# Patient Record
Sex: Male | Born: 1960 | Hispanic: Yes | Marital: Single | Smoking: Never smoker
Health system: Southern US, Community
[De-identification: ages and names within clinical notes are randomized; demographics above are authoritative.]

## PROBLEM LIST (undated history)

## (undated) DIAGNOSIS — I10 Essential (primary) hypertension: Secondary | ICD-10-CM

## (undated) DIAGNOSIS — E119 Type 2 diabetes mellitus without complications: Secondary | ICD-10-CM

## (undated) HISTORY — DX: Essential (primary) hypertension: I10

---

## 2010-05-08 ENCOUNTER — Emergency Department (HOSPITAL_COMMUNITY): Admission: EM | Admit: 2010-05-08 | Discharge: 2010-05-08 | Payer: Self-pay | Admitting: Emergency Medicine

## 2010-09-10 LAB — POCT I-STAT 3, VENOUS BLOOD GAS (G3P V)
Acid-Base Excess: 5 mmol/L — ABNORMAL HIGH (ref 0.0–2.0)
Bicarbonate: 33.1 meq/L — ABNORMAL HIGH (ref 20.0–24.0)
O2 Saturation: 53 %
TCO2: 35 mmol/L (ref 0–100)
pCO2, Ven: 59.1 mmHg — ABNORMAL HIGH (ref 45.0–50.0)
pH, Ven: 7.356 — ABNORMAL HIGH (ref 7.250–7.300)
pO2, Ven: 30 mmHg (ref 30.0–45.0)

## 2010-09-10 LAB — DIFFERENTIAL
Basophils Relative: 0 % (ref 0–1)
Eosinophils Absolute: 0.1 10*3/uL (ref 0.0–0.7)
Eosinophils Relative: 1 % (ref 0–5)
Lymphs Abs: 2.1 10*3/uL (ref 0.7–4.0)

## 2010-09-10 LAB — GLUCOSE, CAPILLARY
Glucose-Capillary: 169 mg/dL — ABNORMAL HIGH (ref 70–99)
Glucose-Capillary: 328 mg/dL — ABNORMAL HIGH (ref 70–99)
Glucose-Capillary: 497 mg/dL — ABNORMAL HIGH (ref 70–99)

## 2010-09-10 LAB — BASIC METABOLIC PANEL
BUN: 16 mg/dL (ref 6–23)
CO2: 29 mEq/L (ref 19–32)
Chloride: 92 mEq/L — ABNORMAL LOW (ref 96–112)
Creatinine, Ser: 0.99 mg/dL (ref 0.4–1.5)
GFR calc Af Amer: >60 mL/min (ref >60)
Glucose, Bld: 408 mg/dL — ABNORMAL HIGH (ref 70–99)

## 2010-09-10 LAB — CBC
MCH: 32.1 pg (ref 26.0–34.0)
MCHC: 36.4 g/dL — ABNORMAL HIGH (ref 30.0–36.0)
MCV: 88 fL (ref 78.0–100.0)
Platelets: 240 10*3/uL (ref 150–400)
RBC: 5.49 MIL/uL (ref 4.22–5.81)

## 2010-09-10 LAB — URINALYSIS, ROUTINE W REFLEX MICROSCOPIC
Bilirubin Urine: NEGATIVE
Glucose, UA: >1000 mg/dL — AB
Ketones, ur: NEGATIVE mg/dL
pH: 6 (ref 5.0–8.0)

## 2014-09-11 ENCOUNTER — Ambulatory Visit (INDEPENDENT_AMBULATORY_CARE_PROVIDER_SITE_OTHER): Payer: Self-pay | Admitting: Internal Medicine

## 2014-09-11 VITALS — BP 132/84 | HR 89 | Temp 98.2°F | Resp 17 | Ht 67.0 in | Wt 182.0 lb

## 2014-09-11 DIAGNOSIS — Z789 Other specified health status: Secondary | ICD-10-CM

## 2014-09-11 DIAGNOSIS — I10 Essential (primary) hypertension: Secondary | ICD-10-CM

## 2014-09-11 DIAGNOSIS — Z79899 Other long term (current) drug therapy: Secondary | ICD-10-CM

## 2014-09-11 DIAGNOSIS — E118 Type 2 diabetes mellitus with unspecified complications: Secondary | ICD-10-CM

## 2014-09-11 LAB — POCT CBC
Granulocyte percent: 67.9 %G (ref 37–80)
HCT, POC: 48.4 % — AB (ref 37.7–47.9)
HEMOGLOBIN: 15.8 g/dL (ref 12.2–16.2)
LYMPH, POC: 1.5 (ref 0.6–3.4)
MCH: 30.9 pg (ref 27–31.2)
MCHC: 32.6 g/dL (ref 31.8–35.4)
MCV: 94.9 fL (ref 80–97)
MID (cbc): 0.4 (ref 0–0.9)
MPV: 7 fL (ref 0–99.8)
POC Granulocyte: 4 (ref 2–6.9)
POC LYMPH PERCENT: 24.9 %L (ref 10–50)
POC MID %: 7.2 % (ref 0–12)
Platelet Count, POC: 265 10*3/uL (ref 142–424)
RBC: 5.1 M/uL (ref 4.04–5.48)
RDW, POC: 12.9 %
WBC: 5.9 10*3/uL (ref 4.6–10.2)

## 2014-09-11 LAB — POCT URINALYSIS DIPSTICK
BILIRUBIN UA: NEGATIVE
Glucose, UA: 100
Ketones, UA: NEGATIVE
Leukocytes, UA: NEGATIVE
Nitrite, UA: NEGATIVE
Protein, UA: 30
RBC UA: NEGATIVE
SPEC GRAV UA: 1.02
Urobilinogen, UA: 0.2
pH, UA: 6

## 2014-09-11 LAB — COMPREHENSIVE METABOLIC PANEL
ALBUMIN: 4.6 g/dL (ref 3.5–5.2)
ALT: 22 U/L (ref 0–35)
AST: 18 U/L (ref 0–37)
Alkaline Phosphatase: 63 U/L (ref 39–117)
BILIRUBIN TOTAL: 0.9 mg/dL (ref 0.2–1.2)
BUN: 14 mg/dL (ref 6–23)
CHLORIDE: 96 meq/L (ref 96–112)
CO2: 31 mEq/L (ref 19–32)
Calcium: 10 mg/dL (ref 8.4–10.5)
Creat: 0.77 mg/dL (ref 0.50–1.10)
Glucose, Bld: 255 mg/dL — ABNORMAL HIGH (ref 70–99)
Potassium: 4.3 mEq/L (ref 3.5–5.3)
Sodium: 136 mEq/L (ref 135–145)
Total Protein: 8.2 g/dL (ref 6.0–8.3)

## 2014-09-11 LAB — LIPID PANEL
Cholesterol: 119 mg/dL (ref 0–200)
HDL: 42 mg/dL — ABNORMAL LOW (ref >=46)
LDL CALC: 48 mg/dL (ref 0–99)
Total CHOL/HDL Ratio: 2.8 Ratio
Triglycerides: 147 mg/dL (ref <150)
VLDL: 29 mg/dL (ref 0–40)

## 2014-09-11 LAB — POCT UA - MICROSCOPIC ONLY
Bacteria, U Microscopic: NEGATIVE
CRYSTALS, UR, HPF, POC: NEGATIVE
Casts, Ur, LPF, POC: NEGATIVE
MUCUS UA: NEGATIVE
YEAST UA: NEGATIVE

## 2014-09-11 LAB — IFOBT (OCCULT BLOOD): IFOBT: NEGATIVE

## 2014-09-11 LAB — POCT GLYCOSYLATED HEMOGLOBIN (HGB A1C): Hemoglobin A1C: 9.7

## 2014-09-11 LAB — GLUCOSE, POCT (MANUAL RESULT ENTRY): POC Glucose: 284 mg/dl — AB (ref 70–99)

## 2014-09-11 MED ORDER — LISINOPRIL-HYDROCHLOROTHIAZIDE 20-12.5 MG PO TABS: 1.0000 | ORAL_TABLET | Freq: Every day | ORAL | Status: DC

## 2014-09-11 MED ORDER — GLIPIZIDE 5 MG PO TABS: ORAL_TABLET | ORAL | Status: DC

## 2014-09-11 MED ORDER — METFORMIN HCL 1000 MG PO TABS: 1000.0000 mg | ORAL_TABLET | Freq: Two times a day (BID) | ORAL | Status: DC

## 2014-09-11 NOTE — Progress Notes (Signed)
Subjective:    Patient ID: Joshua Johnston, male    DOB: 10/08/1960, 54 y.o.   MRN: [id number]  HPI Speaks no englsh, office interpretor present. Patient feels good, needs refills of metformina and lisenipril. Has no MD, left town. Needs new MD., Requests cholesterol check also. This man has no insurance and cannot afford all care he needs today. Review of Systems  Constitutional: Negative.   HENT: Negative.   Eyes: Negative.   Respiratory: Negative.   Cardiovascular: Negative.   Gastrointestinal: Negative.   Endocrine: Negative.   Genitourinary: Negative.   Neurological: Negative.   Hematological: Negative.        Objective:   Physical Exam  Constitutional: She is oriented to person, place, and time. She appears well-developed and well-nourished. No distress.  HENT:  Head: Normocephalic.  Right Ear: External ear normal.  Left Ear: External ear normal.  Nose: Nose normal.  Mouth/Throat: Oropharynx is clear and moist.  Eyes: Conjunctivae and EOM are normal. Pupils are equal, round, and reactive to light.  Neck: Normal range of motion. Neck supple.  Cardiovascular: Normal rate, regular rhythm and normal heart sounds.   Pulmonary/Chest: Effort normal and breath sounds normal.  Abdominal: Soft. Bowel sounds are normal. He exhibits no distension and no mass. There is no tenderness. There is no rebound and no guarding. Hernia confirmed negative in the right inguinal area and confirmed negative in the left inguinal area.  Genitourinary: Rectum normal, prostate normal, testes normal and penis normal. Uncircumcised.  Musculoskeletal: Normal range of motion.  Lymphadenopathy:       Right: No inguinal adenopathy present.       Left: No inguinal adenopathy present.  Neurological: She is alert and oriented to person, place, and time. No cranial nerve deficit or sensory deficit. She exhibits normal muscle tone. Coordination normal.  Psychiatric: She has a normal mood and affect. Her  behavior is normal. Thought content normal.  Vitals reviewed.  Results for orders placed or performed in visit on 09/11/14  POCT CBC  Result Value Ref Range   WBC 5.9 4.6 - 10.2 K/uL   Lymph, poc 1.5 0.6 - 3.4   POC LYMPH PERCENT 24.9 10 - 50 %L   MID (cbc) 0.4 0 - 0.9   POC MID % 7.2 0 - 12 %M   POC Granulocyte 4.0 2 - 6.9   Granulocyte percent 67.9 37 - 80 %G   RBC 5.10 4.04 - 5.48 M/uL   Hemoglobin 15.8 12.2 - 16.2 g/dL   HCT, POC 78.2 (A) 95.6 - 47.9 %   MCV 94.9 80 - 97 fL   MCH, POC 30.9 27 - 31.2 pg   MCHC 32.6 31.8 - 35.4 g/dL   RDW, POC 21.3 %   Platelet Count, POC 265 142 - 424 K/uL   MPV 7.0 0 - 99.8 fL  POCT glucose (manual entry)  Result Value Ref Range   POC Glucose 284 (A) 70 - 99 mg/dl  POCT glycosylated hemoglobin (Hb A1C)  Result Value Ref Range   Hemoglobin A1C 9.7   POCT UA - Microscopic Only  Result Value Ref Range   WBC, Ur, HPF, POC 1-2    RBC, urine, microscopic 1-2    Bacteria, U Microscopic neg    Mucus, UA neg    Epithelial cells, urine per micros 0-1    Crystals, Ur, HPF, POC neg    Casts, Ur, LPF, POC neg    Yeast, UA neg   POCT urinalysis dipstick  Result Value Ref Range   Color, UA yellow    Clarity, UA clear    Glucose, UA 100    Bilirubin, UA neg    Ketones, UA neg    Spec Grav, UA 1.020    Blood, UA neg    pH, UA 6.0    Protein, UA 30    Urobilinogen, UA 0.2    Nitrite, UA neg    Leukocytes, UA Negative   IFOBT POC (occult bld, rslt in office)  Result Value Ref Range   IFOBT Negative           Assessment & Plan:

## 2014-09-11 NOTE — Patient Instructions (Signed)
Hipertensin (Hypertension) La hipertensin, conocida comnmente como presin arterial alta, se produce cuando la sangre bombea en las arterias con mucha fuerza. Las arterias son los vasos sanguneos que transportan la sangre desde el corazn hacia todas las partes del cuerpo. Una lectura de la presin arterial consiste en un nmero ms alto sobre un nmero ms bajo, por ejemplo, 110/72. El nmero ms alto (presin sistlica) corresponde a la presin interna de las arterias cuando el corazn [place]. El nmero ms bajo (presin diastlica) corresponde a la presin interna de las arterias cuando el corazn se relaja. En condiciones ideales, la presin arterial debe ser inferior a 120/80. La hipertensin fuerza al corazn a trabajar ms para Herbalist. Las arterias pueden estrecharse o ponerse rgidas. La hipertensin conlleva el riesgo de enfermedad cardaca, ictus y otros problemas.  [place] de riesgo de hipertensin son controlables, pero otros no lo son.  [employer] factores de riesgo que usted no puede Chief Technology Officer, se incluyen:   Manufacturing systems engineer. El riesgo es mayor para las Retail banker.  La edad. Los riesgos aumentan con la edad.  El sexo. Antes de los 45aos, los hombres corren ms [employer]. Despus de los 65aos, las mujeres corren ms [employer]. Entre los factores de riesgo que usted puede Chief Technology Officer, se incluyen:  No hacer la cantidad suficiente de actividad fsica o ejercicio.  Tener sobrepeso.  Consumir mucha grasa, azcar, caloras o sal en la dieta.  Beber alcohol en exceso. SIGNOS Y SNTOMAS Por lo general, la hipertensin no causa signos o sntomas. La hipertensin demasiado alta (crisis hipertensiva) puede causar dolor de cabeza, ansiedad, falta de aire y hemorragia nasal. DIAGNSTICO  Para detectar si usted tiene hipertensin, el mdico le medir la presin arterial mientras est sentado, con el brazo  levantado a la altura del corazn. Debe medirla al [facility] veces en el mismo brazo. Determinadas condiciones pueden causar una diferencia de presin arterial entre el brazo izquierdo y Insurance underwriter. El hecho de tener una sola lectura de la presin arterial ms alta que lo normal no significa que Stage manager. En el caso de tener una lectura de la presin arterial con un valor alto, pdale al mdico que la verifique nuevamente. [place] hipertensin arterial incluye hacer cambios en el estilo de vida y, posiblemente, tomar medicamentos. Un estilo de vida saludable puede ayudar a bajar la presin arterial alta. Quiz deba cambiar algunos hbitos. Los [employer] en el estilo de vida pueden incluir:  Seguir la dieta DASH. Esta dieta tiene un alto contenido de frutas, verduras y Psychologist, prison and probation services. Incluye poca cantidad de sal, carnes rojas y azcares agregados.  Hacer al menos 2horas de actividad fsica enrgica todas las semanas.  Perder peso, si es necesario.  No fumar.  Limitar el consumo de bebidas alcohlicas.  Aprender formas de reducir el estrs. Si los cambios en el estilo de vida no son suficientes para Child psychotherapist la presin arterial, el mdico puede recetarle medicamentos. Quiz necesite tomar ms de uno. Trabaje en conjunto con su mdico para comprender los riesgos y los beneficios. INSTRUCCIONES PARA EL CUIDADO EN EL HOGAR  Haga que le midan de nuevo la presin arterial segn las indicaciones del [place] los medicamentos solamente como se lo haya indicado el mdico. Siga cuidadosamente las indicaciones. Los medicamentos para la presin arterial deben tomarse segn las indicaciones. Los medicamentos pierden eficacia al omitir las dosis. El hecho de omitir  las dosis tambin [address] de otros [place].  No fume.  Contrlese la presin arterial en su casa segn las indicaciones del mdico. SOLICITE ATENCIN MDICA SI:   Piensa  que tiene una reaccin alrgica a los medicamentos.  Tiene mareos o dolores de cabeza con Naval architect.  Tiene hinchazn en los tobillos.  Tiene problemas de visin. SOLICITE ATENCIN MDICA DE INMEDIATO SI:  Siente un dolor de cabeza intenso o confusin.  Siente debilidad inusual, adormecimiento o que Hospital doctor.  Siente dolor intenso en el pecho o en el abdomen.  Vomita repetidas veces.  Tiene dificultad para respirar. ASEGRESE DE QUE:   Comprende estas instrucciones.  Controlar su afeccin.  Recibir ayuda de inmediato si no mejora o si empeora. Document Released: 06/16/2005 Document Revised: 10/31/2013 [facility] Patient Information 2015 [place], [place]. This information is not intended to replace advice given to you by your health care provider. Make sure you discuss any questions you have with your health care provider. Recuento bsico de carbohidratos para la diabetes mellitus (Basic Carbohydrate Counting for Diabetes Mellitus) El recuento de carbohidratos es un mtodo destinado a calcular la cantidad de carbohidratos en la dieta. El consumo de carbohidratos aumenta naturalmente el nivel de azcar (glucosa) en la sangre, por lo que es importante que sepa la cantidad que debe incluir en cada comida. El recuento de carbohidratos ayuda a Futures trader de glucosa en la sangre dentro de los lmites normales. La cantidad permitida de carbohidratos es diferente para cada persona. Un nutricionista puede ayudarlo a calcular la cantidad adecuada para usted. Una vez que sepa la cantidad de carbohidratos que puede consumir, podr calcular los carbohidratos de los alimentos que desea comer. Los siguientes alimentos incluyen carbohidratos:  Granos, como panes y cereales.  Frijoles secos y productos con soja.  Vegetales almidonados, como papas, guisantes y maz.  Nils Pyle y jugos de frutas.  Leche y Dentist.  Dulces y bocadillos, como pastel, galletas, caramelos, papas fritas de  bolsa, refrescos y bebidas frutales con azcar. RECUENTO DE CARBOHIDRATOS [employer] de calcular los carbohidratos de los alimentos. Puede usar cualquiera de [address] o [place] combinacin de [place]. Leer la etiqueta de informacin nutricional de los alimentos envasados La informacin nutricional es una etiqueta incluida en casi todas las bebidas y los alimentos envasados de los [place]. Indica el tamao de la porcin de ese alimento o bebida e informacin sobre los nutrientes de cada porcin, incluso los gramos (g) de carbohidratos por porcin.  Decida la cantidad de porciones que comer o tomar de este alimento o bebida. Multiplique la cantidad de porciones por el nmero de gramos de carbohidratos indicados en la etiqueta para esa porcin. El total ser la cantidad de carbohidratos que consumir al comer ese alimento o tomar esa bebida. Conocer las porciones estndar de los alimentos Cuando coma alimentos no envasados o que no incluyan la informacin nutricional en la etiqueta, deber medir las porciones para poder calcular la cantidad de carbohidratos. Una porcin de la mayora de los alimentos ricos en carbohidratos contiene alrededor de 15g de carbohidratos. La siguiente [employer] tamaos de porcin de los alimentos ricos en carbohidratos que contienen alrededor de 15g de carbohidratos por porcin:   1rebanada de pan (1oz) o 1tortilla de seis pulgadas.  panecillo de hamburguesa o bollito tipo ingls.  4a 6galletas.   de taza de cereal sin azcar y seco.   taza de cereal caliente.   de taza de arroz o pastas.  taza de  pur de papas o de una papa grande al horno.  1taza de frutas frescas o una fruta pequea.  taza de frutas o jugo de frutas enlatados o congelados.  1 taza [employer]e leche.   de taza de yogur descremado sin ningn agregado o de yogur endulzado con edulcorante artificial.  taza de vegetales almidonados, como guisantes, maz o papas,  o de frijoles secos cocidos. Decida la cantidad de porciones Advertising copywriterestndar que comer. Multiplique la cantidad de porciones por 15 (los gramos de carbohidratos en esa porcin). Por ejemplo, si come 2tazas de fresas, habr comido 2porciones y 30g de carbohidratos (2porciones x 15g = 30g). Para las comidas como sopas y guisos, en las que se mezcla ms de un alimento, deber Vebl[place] Northern Santa Fecontar los carbohidratos de cada alimento incluido. EJEMPLO DE RECUENTO DE CARBOHIDRATOS Ejemplo de cena  3 onzas de pechugas de pollo.   de taza de arroz integral.   taza de maz.  1 taza de [place].  1 taza de fresas con crema batida sin azcar. Clculo de carbohidratos [place] 1: Identifique los alimentos que contienen carbohidratos:   Arroz.  Maz.  Leche.  Jinny SandersFresas. [place] 2: Calcule el nmero de porciones que consumir de cada uno:   2 porciones de Surveyor, mineralsarroz.  1 porcin de maz.  1 porcin de leche.  1 porcin de fresas. [place] 3: Multiplique cada una de esas porciones por 15g:   2 porciones de arroz x 15 g = 30 g.  1 porcin de maz x 15 g = 15 g.  1 porcin de leche x 15 g = 15 g.  1 porcin de fresas x 15 g = 15 g. [place] 4: Sume todas las cantidades para Artistconocer el total de gramos de carbohidratos consumidos: 30 g + 15 g + 15 g + 15 g = 75 g. Document Released: 09/08/2011 Document Revised: 10/31/2013 [facility] Patient Information 2015 [place], [place]. This information is not intended to replace advice given to you by your health care provider. Make sure you discuss any questions you have with your health care provider.

## 2014-09-18 ENCOUNTER — Ambulatory Visit (INDEPENDENT_AMBULATORY_CARE_PROVIDER_SITE_OTHER): Payer: Self-pay | Admitting: Internal Medicine

## 2014-09-18 VITALS — BP 132/80 | HR 77 | Temp 97.8°F | Resp 18 | Ht 68.0 in | Wt 184.0 lb

## 2014-09-18 DIAGNOSIS — L299 Pruritus, unspecified: Secondary | ICD-10-CM

## 2014-09-18 DIAGNOSIS — E1310 Other specified diabetes mellitus with ketoacidosis without coma: Secondary | ICD-10-CM

## 2014-09-18 DIAGNOSIS — I10 Essential (primary) hypertension: Secondary | ICD-10-CM

## 2014-09-18 DIAGNOSIS — B356 Tinea cruris: Secondary | ICD-10-CM

## 2014-09-18 DIAGNOSIS — E1110 Type 2 diabetes mellitus with ketoacidosis without coma: Secondary | ICD-10-CM

## 2014-09-18 DIAGNOSIS — Z79899 Other long term (current) drug therapy: Secondary | ICD-10-CM

## 2014-09-18 LAB — GLUCOSE, POCT (MANUAL RESULT ENTRY): POC GLUCOSE: 241 mg/dL — AB (ref 70–99)

## 2014-09-18 MED ORDER — ECONAZOLE NITRATE 1 % EX CREA: TOPICAL_CREAM | Freq: Two times a day (BID) | CUTANEOUS | Status: DC

## 2014-09-18 NOTE — Patient Instructions (Signed)
Prurito en la Ingle (Jock Itch) Prurito en la ingle es una infeccin por hongos en la piel de la ingle. A veces se lo denomina "culebrilla" pero no proviene de un gusano. Un hongo es un tipo de germen que crece en lugares oscuros y hmedos.  CAUSAS La infeccin podr diseminarse:  Por infeccin de hongos en cualquier parte del cuerpo (como el pie de atleta).  Compartir toallas o ropa. Esta infeccin es ms frecuente en:  Climas clidos y hmedos.  Personas que [place] ropa [place] o trajes de bao hmedos por [employer].  Deportistas.  Personas con sobrepeso.  Personas con diabtes. SNTOMAS Prurito en la ingle causa los siguientes sntomas:  Puntos rojos, rosados o marrones en la ingle. Puede expandirse a los muslos, nalgas y ano.  Picazn. DIAGNSTICO El profesional hace el diagnstico a travs de la observacin de la erupcin. A veces se realiza un raspado de la piel para realizar un anlisis. El anlisis se obtiene mirando el microscopio o mediante un cultivo (prueba para Therapist, music). Este puede tomar [employer]. TRATAMIENTO Prurito en la ingle puede tratarse:  Cremas para la piel o pomadas para matar hongos.  Medicamentos por va oral que destruyen los hongos.  Cremas para la piel o pomadas para calmar la picazn.  Compresas o polvos con medicamentos para secar la piel infectada. INSTRUCCIONES PARA EL CUIDADO DOMICILIARIO  Asegrese de que la erupcin desaparezca completamente con el tratamiento. Siga las indicaciones del mdico. Puede llevar un par de semanas completar la curacin. Si no se trata durante el tiempo suficiente, esta lesin Metallurgist.  Use ropas sueltas.  Los hombres deben usar boxers de algodn cortos.  Las mujeres deben usar ropa interior de algodn.  Evite los baos calientes.  Seque la zona de la ingle luego del bao. SOLICITE ANTENCIN MDICA SI:  La erupcin empeora.  Se extiende.  Aparece  nuevamente luego de completar el tratamiento.  No se cura en el trmino de 4 semanas. Las infecciones micticas son lentas en responder al TEFL teacher. Persiste un enrojecimiento durante varias semanas despus de que el hongo haya desaparecido. SOLICITE ATENCIN MDICA DE INMEDIATO SI:  La zona se vuelve roja, caliente, se hincha o duele.  Tiene fiebre. Document Released: 03/26/2005 Document Revised: 09/08/2011 [facility] Patient Information 2015 [place], [place]. This information is not intended to replace advice given to you by your health care provider. Make sure you discuss any questions you have with your health care provider. Body Ringworm Ringworm (tinea corporis) is a fungal infection of the skin on the body. This infection is not caused by worms, but is actually caused by a fungus. Fungus normally lives on the top of your skin and can be useful. However, in the case of ringworms, the fungus grows out of control and causes a skin infection. It can involve any area of skin on the body and can spread easily from one person to another (contagious). Ringworm is a common problem for children, but it can affect adults as well. Ringworm is also often found in athletes, especially wrestlers who share equipment and mats.  CAUSES  Ringworm of the body is caused by a fungus called dermatophyte. It can spread by:  Touchingother people who are infected.  Touchinginfected pets.  Touching or sharingobjects that have been in contact with the infected person or pet (hats, combs, towels, clothing, sports equipment). SYMPTOMS   Itchy, raised red spots and bumps on the skin.  Ring-shaped rash.  Redness  near the border of the rash with a clear center.  Dry and scaly skin on or around the rash. Not every person develops a ring-shaped rash. Some develop only the red, scaly patches. DIAGNOSIS  Most often, ringworm can be diagnosed by performing a skin exam. Your caregiver may choose to take a skin  scraping from the affected area. The sample will be examined under the microscope to see if the fungus is present.  TREATMENT  Body ringworm may be treated with a topical antifungal cream or ointment. Sometimes, an antifungal shampoo that can be used on your body is prescribed. You may be prescribed antifungal medicines to take by mouth if your ringworm is severe, keeps coming back, or lasts a long time.  HOME CARE INSTRUCTIONS   Only take over-the-counter or prescription medicines as directed by your caregiver.  Wash the infected area and dry it completely before applying yourcream or ointment.  When using antifungal shampoo to treat the ringworm, leave the shampoo on the body for 3-5 minutes before rinsing.   Wear loose clothing to stop clothes from rubbing and irritating the rash.  Wash or change your bed sheets every night while you have the rash.  Have your pet treated by your veterinarian if it has the same infection. To prevent ringworm:   Practice good hygiene.  Wear sandals or shoes in public places and showers.  Do not share personal items with others.  Avoid touching red patches of skin on other people.  Avoid touching pets that have bald spots or wash your hands after doing so. SEEK MEDICAL CARE IF:   Your rash continues to spread after 7 days of treatment.  Your rash is not gone in 4 weeks.  The area around your rash becomes red, warm, tender, and swollen. Document Released: 06/13/2000 Document Revised: 03/10/2012 Document Reviewed: 12/29/2011 [facility] Patient Information 2015 [place], [place]. This information is not intended to replace advice given to you by your health care provider. Make sure you discuss any questions you have with your health care provider.

## 2014-09-18 NOTE — Progress Notes (Signed)
   Subjective:    Patient ID: Joshua Johnston, male    DOB: 10/08/1960, 54 y.o.   MRN: [id number]  HPI Speaks only spanish, [name] interpretor for me. Feels better, taking medicine daily, on new med glipizide 2.5mg  qam. See last visit. Has itching in groin with a rash.    Review of Systems All normal    Objective:   Physical Exam  Constitutional: He is oriented to person, place, and time. He appears well-developed and well-nourished. No distress.  HENT:  Head: Normocephalic.  Mouth/Throat: Oropharynx is clear and moist.  Eyes: EOM are normal. Pupils are equal, round, and reactive to light. No scleral icterus.  Neck: Normal range of motion.  Cardiovascular: Normal rate, regular rhythm and normal heart sounds.   Pulmonary/Chest: Effort normal and breath sounds normal.  Neurological: He is alert and oriented to person, place, and time. He exhibits normal muscle tone. Coordination normal.  Skin: Skin is dry and intact. Rash noted. Rash is macular. There is erythema.     Macular rash, itchy  Vitals reviewed.  Results for orders placed or performed in visit on 09/11/14  POCT CBC  Result Value Ref Range   WBC 5.9 4.6 - 10.2 K/uL   Lymph, poc 1.5 0.6 - 3.4   POC LYMPH PERCENT 24.9 10 - 50 %L   MID (cbc) 0.4 0 - 0.9   POC MID % 7.2 0 - 12 %M   POC Granulocyte 4.0 2 - 6.9   Granulocyte percent 67.9 37 - 80 %G   RBC 5.10 4.04 - 5.48 M/uL   Hemoglobin 15.8 12.2 - 16.2 g/dL   HCT, POC 78.248.4 (A) 95.637.7 - 47.9 %   MCV 94.9 80 - 97 fL   MCH, POC 30.9 27 - 31.2 pg   MCHC 32.6 31.8 - 35.4 g/dL   RDW, POC 21.312.9 %   Platelet Count, POC 265 142 - 424 K/uL   MPV 7.0 0 - 99.8 fL  POCT glucose (manual entry)  Result Value Ref Range   POC Glucose 284 (A) 70 - 99 mg/dl  POCT glycosylated hemoglobin (Hb A1C)  Result Value Ref Range   Hemoglobin A1C 9.7   POCT UA - Microscopic Only  Result Value Ref Range   WBC, Ur, HPF, POC 1-2    RBC, urine, microscopic 1-2    Bacteria, U Microscopic  neg    Mucus, UA neg    Epithelial cells, urine per micros 0-1    Crystals, Ur, HPF, POC neg    Casts, Ur, LPF, POC neg    Yeast, UA neg   POCT urinalysis dipstick  Result Value Ref Range   Color, UA yellow    Clarity, UA clear    Glucose, UA 100    Bilirubin, UA neg    Ketones, UA neg    Spec Grav, UA 1.020    Blood, UA neg    pH, UA 6.0    Protein, UA 30    Urobilinogen, UA 0.2    Nitrite, UA neg    Leukocytes, UA Negative   IFOBT POC (occult bld, rslt in office)  Result Value Ref Range   IFOBT Negative    Results for orders placed or performed in visit on 09/18/14  POCT glucose (manual entry)  Result Value Ref Range   POC Glucose 241 (A) 70 - 99 mg/dl    Improved      Assessment & Plan:  T2DM uncontrolled HTN controlled HTN controlled Tinea cruris/itching Econazole cream

## 2014-11-01 ENCOUNTER — Encounter: Payer: Self-pay | Admitting: Family Medicine

## 2015-07-03 ENCOUNTER — Ambulatory Visit (INDEPENDENT_AMBULATORY_CARE_PROVIDER_SITE_OTHER): Payer: Self-pay | Admitting: Emergency Medicine

## 2015-07-03 VITALS — BP 130/80 | HR 93 | Temp 99.3°F | Ht 67.0 in | Wt 188.0 lb

## 2015-07-03 DIAGNOSIS — Z202 Contact with and (suspected) exposure to infections with a predominantly sexual mode of transmission: Secondary | ICD-10-CM

## 2015-07-03 DIAGNOSIS — E119 Type 2 diabetes mellitus without complications: Secondary | ICD-10-CM

## 2015-07-03 LAB — RPR

## 2015-07-03 LAB — GLUCOSE, POCT (MANUAL RESULT ENTRY): POC GLUCOSE: 286 mg/dL — AB (ref 70–99)

## 2015-07-03 LAB — POCT GLYCOSYLATED HEMOGLOBIN (HGB A1C): Hemoglobin A1C: 10.2

## 2015-07-03 MED ORDER — GLIMEPIRIDE 4 MG PO TABS: 4.0000 mg | ORAL_TABLET | Freq: Two times a day (BID) | ORAL | Status: DC

## 2015-07-03 MED ORDER — METFORMIN HCL 1000 MG PO TABS: 1000.0000 mg | ORAL_TABLET | Freq: Two times a day (BID) | ORAL | Status: DC

## 2015-07-03 NOTE — Progress Notes (Signed)
Subjective:  Patient ID: Joshua Johnston, male    DOB: 01/22/1961  Age: 55 y.o. MRN: [id number]  CC: Follow-up and Medication Refill   HPI Joshua Johnston presents  patients type II diabetic who is apparently not very compliant. Run out of his metformin and takes it twice daily every day. He is on lisinopril but forgot daily was questioned. He is concerned that he had a sexual encounter with a woman who said that she is infected. He doesn't know what she is infected with he's asymptomatic. He has no rash discharge or dysuria. He denies any other complaints. He is a poor understanding of the nature of diabetes or the treatment of diabetes.   History Euel has no past medical history on file.   He has no past surgical history on file.   His  family history is not on file.  He   reports that he has never smoked. He does not have any smokeless tobacco history on file. His alcohol and drug histories are not on file.  No outpatient prescriptions prior to visit.   No facility-administered medications prior to visit.    Social History   Social History  . Marital Status: Single    Spouse Name: N/A  . Number of Children: N/A  . Years of Education: N/A   Social History Main Topics  . Smoking status: Never Smoker   . Smokeless tobacco: None  . Alcohol Use: None  . Drug Use: None  . Sexual Activity: Not Asked   Other Topics Concern  . None   Social History Narrative  . None     Review of Systems  Constitutional: Negative for fever, chills and appetite change.  HENT: Negative for congestion, ear pain, postnasal drip, sinus pressure and sore throat.   Eyes: Negative for pain and redness.  Respiratory: Negative for cough, shortness of breath and wheezing.   Cardiovascular: Negative for leg swelling.  Gastrointestinal: Negative for nausea, vomiting, abdominal pain, diarrhea, constipation and blood in stool.  Endocrine: Negative for polyuria.  Genitourinary: Negative  for dysuria, urgency, frequency and flank pain.  Musculoskeletal: Negative for gait problem.  Skin: Negative for rash.  Neurological: Negative for weakness and headaches.  Psychiatric/Behavioral: Negative for confusion and decreased concentration. The patient is not nervous/anxious.     Objective:  BP 130/80 mmHg  Pulse 93  Temp(Src) 99.3 F (37.4 C) (Oral)  Ht 5\' 7"  (1.702 m)  Wt 188 lb (85.276 kg)  BMI 29.44 kg/m2  SpO2 97%  Physical Exam  Constitutional: He is oriented to person, place, and time. He appears well-developed and well-nourished.  HENT:  Head: Normocephalic and atraumatic.  Eyes: Conjunctivae are normal. Pupils are equal, round, and reactive to light.  Pulmonary/Chest: Effort normal.  Genitourinary: Testes normal and penis normal. Cremasteric reflex is present. Uncircumcised.  Musculoskeletal: He exhibits no edema.  Neurological: He is alert and oriented to person, place, and time.  Skin: Skin is dry.  Psychiatric: He has a normal mood and affect. His behavior is normal. Thought content normal.      Assessment & Plan:   Para MarchFrancisco was seen today for follow-up and medication refill.  Diagnoses and all orders for this visit:  STD exposure -     POCT glucose (manual entry) -     POCT glycosylated hemoglobin (Hb A1C) -     GC/Chlamydia Probe Amp -     RPR  Diabetes mellitus without complication (HCC) -     POCT glucose (manual entry) -  POCT glycosylated hemoglobin (Hb A1C) -     GC/Chlamydia Probe Amp -     RPR  Other orders -     glimepiride (AMARYL) 4 MG tablet; Take 1 tablet (4 mg total) by mouth 2 (two) times daily with a meal. -     metFORMIN (GLUCOPHAGE) 1000 MG tablet; Take 1 tablet (1,000 mg total) by mouth 2 (two) times daily with a meal.   I have changed Mr. Sheppard Evens metFORMIN. I am also having him start on glimepiride.  Meds ordered this encounter  Medications  . DISCONTD: metFORMIN (GLUCOPHAGE) 1000 MG tablet    Sig: Take 1,000 mg  by mouth 2 (two) times daily with a meal.  . glimepiride (AMARYL) 4 MG tablet    Sig: Take 1 tablet (4 mg total) by mouth 2 (two) times daily with a meal.    Dispense:  60 tablet    Refill:  2  . metFORMIN (GLUCOPHAGE) 1000 MG tablet    Sig: Take 1 tablet (1,000 mg total) by mouth 2 (two) times daily with a meal.    Dispense:  60 tablet    Refill:  2   I explained to him that he needs to follow a diet and he needs to takes medication as directed. His sugars not well controlled so I've added  glimepiride. He'll follow-up in 6 weeks for recheck I instructed him to bring all of his medicines with him at the time of his next visit  te red flag conditions were discussed with the patient as well as actions that should be taken.  Patient expressed his understanding.  Follow-up: Return in about 6 weeks (around 08/14/2015).  Carmelina Dane, MD   Results for orders placed or performed in visit on 07/03/15  POCT glucose (manual entry)  Result Value Ref Range   POC Glucose 286 (A) 70 - 99 mg/dl  POCT glycosylated hemoglobin (Hb A1C)  Result Value Ref Range   Hemoglobin A1C 10.2

## 2015-07-03 NOTE — Patient Instructions (Signed)
La diabetes mellitus y los alimentos (Diabetes Mellitus and Food) Es importante que controle su nivel de azcar en la sangre (glucosa). El nivel de glucosa en sangre depende en gran medida de lo que usted come. Comer alimentos saludables en las cantidades adecuadas a lo largo del da, aproximadamente a la misma hora todos los das, lo ayudar a controlar su nivel de glucosa en sangre. Tambin puede ayudarlo a retrasar o evitar el empeoramiento de la diabetes mellitus. Comer de manera saludable incluso puede ayudarlo a mejorar el nivel de presin arterial y a alcanzar o mantener un peso saludable.  Entre las recomendaciones generales para alimentarse y cocinar los alimentos de forma saludable, se incluyen las siguientes:  Respetar las comidas principales y comer colaciones con regularidad. Evitar pasar largos perodos sin comer con el fin de perder peso.  Seguir una dieta que consista principalmente en alimentos de origen vegetal, como frutas, vegetales, frutos secos, legumbres y cereales integrales.  Utilizar mtodos de coccin a baja temperatura, como hornear, en lugar de mtodos de coccin a alta temperatura, como frer en abundante aceite. Trabaje con el nutricionista para aprender a usar la informacin nutricional de las etiquetas de los alimentos. CMO PUEDEN AFECTARME LOS ALIMENTOS? Carbohidratos Los carbohidratos afectan el nivel de glucosa en sangre ms que cualquier otro tipo de alimento. El nutricionista lo ayudar a determinar cuntos carbohidratos puede consumir en cada comida y ensearle a contarlos. El recuento de carbohidratos es importante para mantener la glucosa en sangre en un nivel saludable, en especial si utiliza insulina o toma determinados medicamentos para la diabetes mellitus. Alcohol El alcohol puede provocar disminuciones sbitas de la glucosa en sangre (hipoglucemia), en especial si utiliza insulina o toma determinados medicamentos para la diabetes mellitus. La  hipoglucemia es una afeccin que puede poner en peligro la vida. Los sntomas de la hipoglucemia (somnolencia, mareos y desorientacin) son similares a los sntomas de haber consumido mucho alcohol.  Si el mdico lo autoriza a beber alcohol, hgalo con moderacin y siga estas pautas:  Las mujeres no deben beber ms de un trago por da, y los hombres no deben beber ms de dos tragos por da. Un trago es igual a:  12 onzas (355 ml) de cerveza  5 onzas de vino (150 ml) de vino  1,5onzas (45ml) de bebidas espirituosas  No beba con el estmago vaco.  Mantngase hidratado. Beba agua, gaseosas dietticas o t helado sin azcar.  Las gaseosas comunes, los jugos y otros refrescos podran contener muchos carbohidratos y se deben contar. QU ALIMENTOS NO SE RECOMIENDAN? Cuando haga las elecciones de alimentos, es importante que recuerde que todos los alimentos son distintos. Algunos tienen menos nutrientes que otros por porcin, aunque podran tener la misma cantidad de caloras o carbohidratos. Es difcil darle al cuerpo lo que necesita cuando consume alimentos con menos nutrientes. Estos son algunos ejemplos de alimentos que debera evitar ya que contienen muchas caloras y carbohidratos, pero pocos nutrientes:  Grasas trans (la mayora de los alimentos procesados incluyen grasas trans en la etiqueta de Informacin nutricional).  Gaseosas comunes.  Jugos.  Caramelos.  Dulces, como tortas, pasteles, rosquillas y galletas.  Comidas fritas. QU ALIMENTOS PUEDO COMER? Consuma alimentos ricos en nutrientes, que nutrirn el cuerpo y lo mantendrn saludable. Los alimentos que debe comer tambin dependern de varios factores, como:  Las caloras que necesita.  Los medicamentos que toma.  Su peso.  El nivel de glucosa en sangre.  El nivel de presin arterial.  El nivel de colesterol.   Debe consumir una amplia variedad de alimentos, por ejemplo:  Protenas.  Cortes de carne  magros.  Protenas con bajo contenido de grasas saturadas, como pescado, clara de huevo y frijoles. Evite las carnes procesadas.  Frutas y vegetales.  Frutas y vegetales que pueden ayudar a controlar los niveles sanguneos de glucosa, como manzanas, mangos y batatas.  Productos lcteos.  Elija productos lcteos sin grasa o con bajo contenido de grasa, como leche, yogur y queso.  Cereales, panes, pastas y arroz.  Elija cereales integrales, como panes multicereales, avena en grano y arroz integral. Estos alimentos pueden ayudar a controlar la presin arterial.  Grasas.  Alimentos que contengan grasas saludables, como frutos secos, aguacate, aceite de oliva, aceite de canola y pescado. TODOS LOS QUE PADECEN DIABETES MELLITUS TIENEN EL MISMO PLAN DE COMIDAS? Dado que todas las personas que padecen diabetes mellitus son distintas, no hay un solo plan de comidas que funcione para todos. Es muy importante que se rena con un nutricionista que lo ayudar a crear un plan de comidas adecuado para usted.   Esta informacin no tiene como fin reemplazar el consejo del mdico. Asegrese de hacerle al mdico cualquier pregunta que tenga.   Document Released: 09/23/2007 Document Revised: 07/07/2014 Elsevier Interactive Patient Education 2016 Elsevier Inc.  

## 2015-07-04 LAB — GC/CHLAMYDIA PROBE AMP
CT Probe RNA: NOT DETECTED
GC PROBE AMP APTIMA: NOT DETECTED

## 2015-08-09 ENCOUNTER — Ambulatory Visit (INDEPENDENT_AMBULATORY_CARE_PROVIDER_SITE_OTHER): Payer: Self-pay | Admitting: Family Medicine

## 2015-08-09 VITALS — BP 130/80 | HR 67 | Temp 98.2°F | Resp 20 | Ht 68.0 in | Wt 182.4 lb

## 2015-08-09 DIAGNOSIS — Z79899 Other long term (current) drug therapy: Secondary | ICD-10-CM

## 2015-08-09 DIAGNOSIS — I10 Essential (primary) hypertension: Secondary | ICD-10-CM

## 2015-08-09 DIAGNOSIS — E118 Type 2 diabetes mellitus with unspecified complications: Secondary | ICD-10-CM

## 2015-08-09 DIAGNOSIS — E119 Type 2 diabetes mellitus without complications: Secondary | ICD-10-CM

## 2015-08-09 LAB — GLUCOSE, POCT (MANUAL RESULT ENTRY): POC Glucose: 169 mg/dl — AB (ref 70–99)

## 2015-08-09 MED ORDER — LISINOPRIL-HYDROCHLOROTHIAZIDE 20-12.5 MG PO TABS: 1.0000 | ORAL_TABLET | Freq: Every day | ORAL | Status: AC

## 2015-08-09 MED ORDER — METFORMIN HCL 1000 MG PO TABS: 1000.0000 mg | ORAL_TABLET | Freq: Two times a day (BID) | ORAL | Status: DC

## 2015-08-09 NOTE — Patient Instructions (Signed)
For rash- lotrimin cream at drug store- use two times a day for 5 days

## 2015-08-09 NOTE — Progress Notes (Signed)
   Subjective:    Patient ID: Joshua Johnston, male    DOB: Jan 05, 1961, 55 y.o.   MRN: [id number]  HPI This is a pleasant 55 yo male who presents today to talk about his labs that were done at his last visit. He was seen 07/03/15 for STD screening and follow up of DM and HTN. He is spanish speaking. He has an empty bottle of lisinopril and last took metformin yesterday. He reports that he is taking glimepiride. He does not have insurance and has had episodic care over the last year.   His legs feel shaky since starting glimepiride. Felt fatigued yesterday and took off of work. Feels better today.   Past Medical History  Diagnosis Date  . Hypertension    No past surgical history on file. No family history on file. Social History  Substance Use Topics  . Smoking status: Never Smoker   . Smokeless tobacco: None  . Alcohol Use: None    Review of Systems No chest pain, no SOB, no edema    Objective:   Physical Exam Physical Exam  Constitutional: Oriented to person, place, and time. He appears well-developed and well-nourished.  HENT:  Head: Normocephalic and atraumatic.  Eyes: Conjunctivae are normal.  Neck: Normal range of motion. Neck supple.  Cardiovascular: Normal rate, regular rhythm and normal heart sounds.   Pulmonary/Chest: Effort normal and breath sounds normal.  Musculoskeletal: Normal range of motion.  Neurological: Alert and oriented to person, place, and time.  Skin: Skin is warm and dry.  Psychiatric: Normal mood and affect. Behavior is normal. Judgment and thought content normal.  Vitals reviewed.  BP 130/80 mmHg  Pulse 67  Temp(Src) 98.2 F (36.8 C) (Oral)  Resp 20  Ht  (1.727 m)  Wt 182 lb 6.4 oz (82.736 kg)  BMI 27.74 kg/m2  SpO2 98% Wt Readings from Last 3 Encounters:  08/09/15 182 lb 6.4 oz (82.736 kg)  07/03/15 188 lb (85.276 kg)  09/18/14 184 lb (83.462 kg)   Results for orders placed or performed in visit on 08/09/15  POCT glucose  (manual entry)  Result Value Ref Range   POC Glucose 169 (A) 70 - 99 mg/dl       Assessment & Plan:  1. Diabetes mellitus without complication (HCC) - encouraged patient to eliminate his daily soda, limit portion size of bread, tortilla, rice - POCT glucose (manual entry) - metFORMIN (GLUCOPHAGE) 1000 MG tablet; Take 1 tablet (1,000 mg total) by mouth 2 (two) times daily with a meal.  Dispense: 180 tablet; Refill: 1 - blood sugar improved from last month, too soon to check HgbA1c  2. Essential hypertension - lisinopril-hydrochlorothiazide (PRINZIDE,ZESTORETIC) 20-12.5 MG tablet; Take 1 tablet by mouth daily.  Dispense: 90 tablet; Refill: 1  - Have scheduled him for follow up at the appointment center with Gurney Maxin, PA-C  Olean Ree, FNP-BC  [facility]  08/09/2015 12:14 PM

## 2015-10-08 ENCOUNTER — Ambulatory Visit (INDEPENDENT_AMBULATORY_CARE_PROVIDER_SITE_OTHER): Payer: Self-pay | Admitting: Urgent Care

## 2015-10-08 VITALS — BP 142/82 | HR 90 | Temp 97.9°F | Resp 16 | Ht 68.0 in | Wt 179.8 lb

## 2015-10-08 DIAGNOSIS — IMO0001 Reserved for inherently not codable concepts without codable children: Secondary | ICD-10-CM

## 2015-10-08 DIAGNOSIS — E119 Type 2 diabetes mellitus without complications: Secondary | ICD-10-CM

## 2015-10-08 DIAGNOSIS — R350 Frequency of micturition: Secondary | ICD-10-CM

## 2015-10-08 DIAGNOSIS — E1165 Type 2 diabetes mellitus with hyperglycemia: Secondary | ICD-10-CM

## 2015-10-08 LAB — POC MICROSCOPIC URINALYSIS (UMFC): MUCUS RE: ABSENT

## 2015-10-08 LAB — POCT URINALYSIS DIP (MANUAL ENTRY)
BILIRUBIN UA: NEGATIVE
Bilirubin, UA: NEGATIVE
Glucose, UA: 100 — AB
Leukocytes, UA: NEGATIVE
Nitrite, UA: NEGATIVE
Protein Ur, POC: NEGATIVE
RBC UA: NEGATIVE
Spec Grav, UA: 1.015
UROBILINOGEN UA: 0.2
pH, UA: 6

## 2015-10-08 LAB — POCT GLYCOSYLATED HEMOGLOBIN (HGB A1C): Hemoglobin A1C: 8

## 2015-10-08 MED ORDER — METFORMIN HCL 1000 MG PO TABS: 1000.0000 mg | ORAL_TABLET | Freq: Two times a day (BID) | ORAL | Status: AC

## 2015-10-08 NOTE — Progress Notes (Signed)
    MRN: [id number] DOB: 10/08/1960  Subjective:   Joshua Johnston is a 55 y.o. male presenting for chief complaint of Follow-up  Reports that he is taking Metformin twice daily. He is not taking glimepiride. Reports that in the last week he has been urinating frequently, occasional left side flank tenderness. He also has some blurred vision, tingling of his feet. Hydrates inconsistently. Diet is non-compliant. Denies dizziness, chest pain, shortness of breath, heart racing, palpitations, nausea, vomiting, abdominal pain, hematuria. Denies smoking cigarettes or drinking alcohol.  Tighe has a current medication list which includes the following prescription(s): glimepiride, lisinopril-hydrochlorothiazide, and metformin. Also has No Known Allergies.  [name]  has a past medical history of Hypertension. Also  has no past surgical history on file.  Objective:   Vitals: BP 142/82 mmHg  Pulse 90  Temp(Src) 97.9 F (36.6 C) (Oral)  Resp 16  Ht 5\' 8"  (1.727 m)  Wt 179 lb 12.8 oz (81.557 kg)  BMI 27.34 kg/m2  SpO2 98%  Physical Exam  Constitutional: He is oriented to person, place, and time. He appears well-developed and well-nourished.  HENT:  Mouth/Throat: Oropharynx is clear and moist.  Eyes: No scleral icterus.  Cardiovascular: Normal rate, regular rhythm and intact distal pulses.  Exam reveals no gallop and no friction rub.   No murmur heard. Pulmonary/Chest: No respiratory distress. He has no wheezes. He has no rales.  Abdominal: He exhibits no distension and no mass. There is no tenderness.  Neurological: He is alert and oriented to person, place, and time.  Skin: Skin is warm and dry.   Results for orders placed or performed in visit on 10/08/15 (from the past 24 hour(s))  POCT glycosylated hemoglobin (Hb A1C)     Status: None   Collection Time: 10/08/15  2:44 PM  Result Value Ref Range   Hemoglobin A1C 8.0   POCT urinalysis dipstick     Status: Abnormal   Collection Time: 10/08/15  2:49 PM  Result Value Ref Range   Color, UA yellow yellow   Clarity, UA clear clear   Glucose, UA =100 (A) negative   Bilirubin, UA negative negative   Ketones, POC UA negative negative   Spec Grav, UA 1.015    Blood, UA negative negative   pH, UA 6.0    Protein Ur, POC negative negative   Urobilinogen, UA 0.2    Nitrite, UA Negative Negative   Leukocytes, UA Negative Negative   Assessment and Plan :   1. Uncontrolled type 2 diabetes mellitus without complication, without long-term current use of insulin (HCC) 2. Urinary frequency 3. Diabetes mellitus without complication (HCC) - A1c significantly improved. Continue with Metformin. Counseled on dietary modifications. Recheck in 3 months.  Wallis BambergMario Ivalee Strauser, PA-C [facility] [phone] 10/08/2015 2:22 PM

## 2015-10-08 NOTE — Patient Instructions (Addendum)
La diabetes mellitus y los alimentos (Diabetes Mellitus and Food) Es importante que controle su nivel de azcar en la sangre (glucosa). El nivel de glucosa en sangre depende en gran medida de lo que usted come. Comer alimentos saludables en las cantidades adecuadas a lo largo del da, aproximadamente a la misma hora todos los das, lo ayudar a controlar su nivel de glucosa en sangre. Tambin puede ayudarlo a retrasar o evitar el empeoramiento de la diabetes mellitus. Comer de manera saludable incluso puede ayudarlo a mejorar el nivel de presin arterial y a alcanzar o mantener un peso saludable.  Entre las recomendaciones generales para alimentarse y cocinar los alimentos de forma saludable, se incluyen las siguientes:  Respetar las comidas principales y comer colaciones con regularidad. Evitar pasar largos perodos sin comer con el fin de perder peso.  Seguir una dieta que consista principalmente en alimentos de origen vegetal, como frutas, vegetales, frutos secos, legumbres y cereales integrales.  Utilizar mtodos de coccin a baja temperatura, como hornear, en lugar de mtodos de coccin a alta temperatura, como frer en abundante aceite. Trabaje con el nutricionista para aprender a usar la informacin nutricional de las etiquetas de los alimentos. CMO PUEDEN AFECTARME LOS ALIMENTOS? Carbohidratos Los carbohidratos afectan el nivel de glucosa en sangre ms que cualquier otro tipo de alimento. El nutricionista lo ayudar a determinar cuntos carbohidratos puede consumir en cada comida y ensearle a contarlos. El recuento de carbohidratos es importante para mantener la glucosa en sangre en un nivel saludable, en especial si utiliza insulina o toma determinados medicamentos para la diabetes mellitus. Alcohol El alcohol puede provocar disminuciones sbitas de la glucosa en sangre (hipoglucemia), en especial si utiliza insulina o toma determinados medicamentos para la diabetes mellitus. La  hipoglucemia es una afeccin que puede poner en peligro la vida. Los sntomas de la hipoglucemia (somnolencia, mareos y desorientacin) son similares a los sntomas de haber consumido mucho alcohol.  Si el mdico lo autoriza a beber alcohol, hgalo con moderacin y siga estas pautas:  Las mujeres no deben beber ms de un trago por da, y los hombres no deben beber ms de dos tragos por da. Un trago es igual a:  12 onzas (355 ml) de cerveza  5 onzas de vino (150 ml) de vino  1,5onzas (45ml) de bebidas espirituosas  No beba con el estmago vaco.  Mantngase hidratado. Beba agua, gaseosas dietticas o t helado sin azcar.  Las gaseosas comunes, los jugos y otros refrescos podran contener muchos carbohidratos y se deben contar. QU ALIMENTOS NO SE RECOMIENDAN? Cuando haga las elecciones de alimentos, es importante que recuerde que todos los alimentos son distintos. Algunos tienen menos nutrientes que otros por porcin, aunque podran tener la misma cantidad de caloras o carbohidratos. Es difcil darle al cuerpo lo que necesita cuando consume alimentos con menos nutrientes. Estos son algunos ejemplos de alimentos que debera evitar ya que contienen muchas caloras y carbohidratos, pero pocos nutrientes:  Grasas trans (la mayora de los alimentos procesados incluyen grasas trans en la etiqueta de Informacin nutricional).  Gaseosas comunes.  Jugos.  Caramelos.  Dulces, como tortas, pasteles, rosquillas y galletas.  Comidas fritas. QU ALIMENTOS PUEDO COMER? Consuma alimentos ricos en nutrientes, que nutrirn el cuerpo y lo mantendrn saludable. Los alimentos que debe comer tambin dependern de varios factores, como:  Las caloras que necesita.  Los medicamentos que toma.  Su peso.  El nivel de glucosa en sangre.  El nivel de presin arterial.  El nivel de colesterol.   Debe consumir una amplia variedad de alimentos, por ejemplo:  Protenas.  Cortes de carne  magros.  Protenas con bajo contenido de grasas saturadas, como pescado, clara de huevo y frijoles. Evite las carnes procesadas.  Frutas y vegetales.  Frutas y vegetales que pueden ayudar a controlar los niveles sanguneos de glucosa, como manzanas, mangos y batatas.  Productos lcteos.  Elija productos lcteos sin grasa o con bajo contenido de grasa, como leche, yogur y queso.  Cereales, panes, pastas y arroz.  Elija cereales integrales, como panes multicereales, avena en grano y arroz integral. Estos alimentos pueden ayudar a controlar la presin arterial.  Grasas.  Alimentos que contengan grasas saludables, como frutos secos, aguacate, aceite de oliva, aceite de canola y pescado. TODOS LOS QUE PADECEN DIABETES MELLITUS TIENEN EL MISMO PLAN DE COMIDAS? Dado que todas las personas que padecen diabetes mellitus son distintas, no hay un solo plan de comidas que funcione para todos. Es muy importante que se rena con un nutricionista que lo ayudar a crear un plan de comidas adecuado para usted.   Esta informacin no tiene como fin reemplazar el consejo del mdico. Asegrese de hacerle al mdico cualquier pregunta que tenga.   Document Released: 09/23/2007 Document Revised: 07/07/2014 Elsevier Interactive Patient Education 2016 Elsevier Inc.     IF you received an x-ray today, you will receive an invoice from [facility]. Please contact [facility] at [phone] with questions or concerns regarding your invoice.   IF you received labwork today, you will receive an invoice from [facility]. Please contact [facility] at [phone] with questions or concerns regarding your invoice.   Our billing staff will not be able to assist you with questions regarding bills from these companies.  You will be contacted with the lab results as soon as they are available. The fastest way to get your results is to activate your My Chart account.  Instructions are located on the last page of this paperwork. If you have not heard from us regarding the results in 2 weeks, please contact this office.      

## 2015-10-11 ENCOUNTER — Ambulatory Visit: Payer: Self-pay | Admitting: Urgent Care

## 2016-03-27 ENCOUNTER — Other Ambulatory Visit: Payer: Self-pay | Admitting: Family Medicine

## 2016-03-27 DIAGNOSIS — I10 Essential (primary) hypertension: Secondary | ICD-10-CM

## 2017-06-10 ENCOUNTER — Emergency Department (HOSPITAL_COMMUNITY): Payer: Self-pay

## 2017-06-10 ENCOUNTER — Encounter (HOSPITAL_COMMUNITY): Payer: Self-pay

## 2017-06-10 ENCOUNTER — Emergency Department (HOSPITAL_COMMUNITY)
Admission: EM | Admit: 2017-06-10 | Discharge: 2017-06-10 | Disposition: A | Discharge status: Home / Self Care | Payer: Self-pay | Attending: Emergency Medicine | Admitting: Emergency Medicine

## 2017-06-10 DIAGNOSIS — Z79899 Other long term (current) drug therapy: Secondary | ICD-10-CM | POA: Insufficient documentation

## 2017-06-10 DIAGNOSIS — Y929 Unspecified place or not applicable: Secondary | ICD-10-CM | POA: Insufficient documentation

## 2017-06-10 DIAGNOSIS — S060X1A Concussion with loss of consciousness of 30 minutes or less, initial encounter: Secondary | ICD-10-CM | POA: Insufficient documentation

## 2017-06-10 DIAGNOSIS — Y939 Activity, unspecified: Secondary | ICD-10-CM | POA: Insufficient documentation

## 2017-06-10 DIAGNOSIS — W228XXA Striking against or struck by other objects, initial encounter: Secondary | ICD-10-CM | POA: Insufficient documentation

## 2017-06-10 DIAGNOSIS — Y999 Unspecified external cause status: Secondary | ICD-10-CM | POA: Insufficient documentation

## 2017-06-10 DIAGNOSIS — I10 Essential (primary) hypertension: Secondary | ICD-10-CM | POA: Insufficient documentation

## 2017-06-10 DIAGNOSIS — E119 Type 2 diabetes mellitus without complications: Secondary | ICD-10-CM | POA: Insufficient documentation

## 2017-06-10 DIAGNOSIS — Z7984 Long term (current) use of oral hypoglycemic drugs: Secondary | ICD-10-CM | POA: Insufficient documentation

## 2017-06-10 HISTORY — DX: Type 2 diabetes mellitus without complications: E11.9

## 2017-06-10 MED ORDER — METHOCARBAMOL 500 MG PO TABS
1000.0000 mg | ORAL_TABLET | Freq: Once | ORAL | Status: AC
  Administered 2017-06-10: 1000 mg via ORAL
  Filled 2017-06-10: qty 2

## 2017-06-10 MED ORDER — IBUPROFEN 200 MG PO TABS
600.0000 mg | ORAL_TABLET | Freq: Once | ORAL | Status: AC
  Administered 2017-06-10: 600 mg via ORAL
  Filled 2017-06-10: qty 3

## 2017-06-10 MED ORDER — METHOCARBAMOL 500 MG PO TABS: 1000.0000 mg | ORAL_TABLET | Freq: Three times a day (TID) | ORAL | 0 refills | Status: AC | PRN

## 2017-06-10 MED ORDER — IBUPROFEN 600 MG PO TABS: 600.0000 mg | ORAL_TABLET | Freq: Four times a day (QID) | ORAL | 0 refills | Status: AC | PRN

## 2017-06-10 NOTE — ED Provider Notes (Signed)
[facility]-EMERGENCY DEPT Provider Note   CSN: [id number] Arrival date & time: 06/10/17  [id number]     History   Chief Complaint Chief Complaint  Patient presents with  . Fall  . Loss of Consciousness    HPI Roe CoombsFrancisco Romero Hererra is a 56 y.o. male.  HPI Patient with slip and fall from a standing position today.  Patient struck the right side of his head.  Loss of consciousness for roughly 2 minutes.  Denies focal weakness or numbness.  Denies any neck pain.  No nausea or vomiting.  No visual changes.  Past Medical History:  Diagnosis Date  . Diabetes mellitus without complication (HCC)   . Hypertension     There are no active problems to display for this patient.   History reviewed. No pertinent surgical history.  OB History    Gravida Para Term Preterm AB Living   0 0 0 0 0     SAB TAB Ectopic Multiple Live Births   0 0 0           Home Medications    Prior to Admission medications   Medication Sig Start Date End Date Taking? Authorizing Provider  Multiple Vitamin (MULTIVITAMIN) LIQD Take 5 mLs by mouth daily.   Yes [provider]  PRESCRIPTION MEDICATION Take by mouth daily. Gets medication to protect kidney's liquid form from [place]   Yes [provider]  ibuprofen (ADVIL,MOTRIN) 600 MG tablet Take 1 tablet (600 mg total) by mouth every 6 (six) hours as needed. 06/10/17   Loren RacerYelverton, Mister Krahenbuhl, MD  lisinopril-hydrochlorothiazide (PRINZIDE,ZESTORETIC) 20-12.5 MG tablet Take 1 tablet by mouth daily. 08/09/15   Emi BelfastGessner, Deborah B, FNP  metFORMIN (GLUCOPHAGE) 1000 MG tablet Take 1 tablet (1,000 mg total) by mouth 2 (two) times daily with a meal. 10/08/15   Wallis BambergMani, Mario, PA-C  methocarbamol (ROBAXIN) 500 MG tablet Take 2 tablets (1,000 mg total) by mouth every 8 (eight) hours as needed for muscle spasms. 06/10/17   Loren RacerYelverton, Earle Troiano, MD    Family History History reviewed. No pertinent family history.  Social History Social  History   Tobacco Use  . Smoking status: Never Smoker  . Smokeless tobacco: Never Used  Substance Use Topics  . Alcohol use: No    Alcohol/week: 0.0 oz    Frequency: Never  . Drug use: No     Allergies   Patient has no known allergies.   Review of Systems Review of Systems  Constitutional: Negative for chills and fever.  Eyes: Negative for visual disturbance.  Respiratory: Negative for cough and shortness of breath.   Cardiovascular: Negative for chest pain, palpitations and leg swelling.  Gastrointestinal: Negative for abdominal pain, diarrhea, nausea and vomiting.  Musculoskeletal: Negative for back pain, myalgias and neck pain.  Skin: Negative for rash and wound.  Neurological: Positive for syncope and headaches. Negative for weakness and numbness.  All other systems reviewed and are negative.    Physical Exam Updated Vital Signs BP 137/75 (BP Location: Left Arm)   Pulse 95   Temp 99.4 F (37.4 C) (Oral)   Resp 16   SpO2 98%   Physical Exam  Constitutional: He is oriented to person, place, and time. He appears well-developed and well-nourished.  HENT:  Head: Normocephalic.  Mouth/Throat: Oropharynx is clear and moist.  Mild right temporal tenderness to palpation.  Midface is stable.  Eyes: EOM are normal. Pupils are equal, round, and reactive to light.  Neck: Normal range of motion.  Neck supple.  No posterior midline cervical tenderness to palpation.  Cardiovascular: Normal rate and regular rhythm.  Pulmonary/Chest: Effort normal and breath sounds normal.  Abdominal: Soft. Bowel sounds are normal. There is no tenderness. There is no rebound and no guarding.  Musculoskeletal: Normal range of motion. He exhibits no edema or tenderness.  Neurological: He is alert and oriented to person, place, and time.  Patient is alert and oriented x3 with clear, goal oriented speech. Patient has 5/5 motor in all extremities. Sensation is intact to light touch.  Skin: Skin is  warm and dry. Capillary refill takes less than 2 seconds. No rash noted. No erythema.  Psychiatric: He has a normal mood and affect. His behavior is normal.  Nursing note and vitals reviewed.    ED Treatments / Results  Labs (all labs ordered are listed, but only abnormal results are displayed) Labs Reviewed - No data to display  EKG  EKG Interpretation None       Radiology Ct Head Wo Contrast  Result Date: 06/10/2017 CLINICAL DATA:  Status post slip and fall on ice with a blow to the back of the head today. EXAM: CT HEAD WITHOUT CONTRAST TECHNIQUE: Contiguous axial images were obtained from the base of the skull through the vertex without intravenous contrast. COMPARISON:  None. FINDINGS: Brain: No evidence of acute infarction, hemorrhage, hydrocephalus, extra-axial collection or mass lesion/mass effect. Vascular: No hyperdense vessel or unexpected calcification. Skull: Intact. Sinuses/Orbits: Negative. Other: None. IMPRESSION: Negative head CT. Electronically Signed   By: Drusilla Kannerhomas  Dalessio M.D.   On: 06/10/2017 11:07    Procedures Procedures (including critical care time)  Medications Ordered in ED Medications  ibuprofen (ADVIL,MOTRIN) tablet 600 mg (600 mg Oral Given 06/10/17 1206)  methocarbamol (ROBAXIN) tablet 1,000 mg (1,000 mg Oral Given 06/10/17 1206)     Initial Impression / Assessment and Plan / ED Course  I have reviewed the triage vital signs and the nursing notes.  Pertinent labs & imaging results that were available during my care of the patient were reviewed by me and considered in my medical decision making (see chart for details).    CT head with no acute findings.  Patient has normal neurologic exam.  Will give concussion precautions.   Final Clinical Impressions(s) / ED Diagnoses   Final diagnoses:  Concussion with loss of consciousness of 30 minutes or less, initial encounter    ED Discharge Orders        Ordered    ibuprofen (ADVIL,MOTRIN) 600  MG tablet  Every 6 hours PRN     06/10/17 1246    methocarbamol (ROBAXIN) 500 MG tablet  Every 8 hours PRN     06/10/17 1246       Loren RacerYelverton, Aslyn Cottman, MD 06/10/17 1248

## 2017-06-10 NOTE — ED Triage Notes (Signed)
Per EMS, pt from work.  Pt ambulating and slipped on ice.  Fell backwards striking head.  LOC x 2 min.  No laceration.  c-collar in place.  Alert and oriented.  Vitals 146/98, hr 90, resp 16, 98%ra, cbg 321  20g Lt hand

## 2018-12-11 IMAGING — CT CT HEAD W/O CM
3 series · 14 of 47 positions shown, 16 images · non-contrast
Comparison: None.

CLINICAL DATA: Status post slip and fall on ice with a blow to the
back of the head today.

EXAM:
CT HEAD WITHOUT CONTRAST
TECHNIQUE: Contiguous axial images were obtained from the base of the skull
through the vertex without intravenous contrast.

[Series 3: head wo · axial · 0.46mm/px · z∈[-137,-7]mm · 8 of 32 slices shown, 10 images]
[im 3/32  brain]
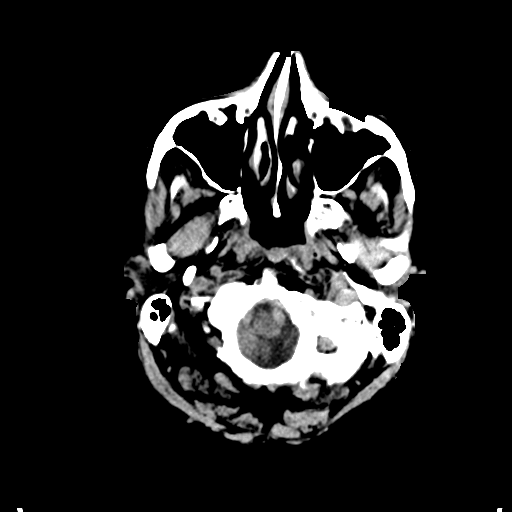
[im 3/32  bone]
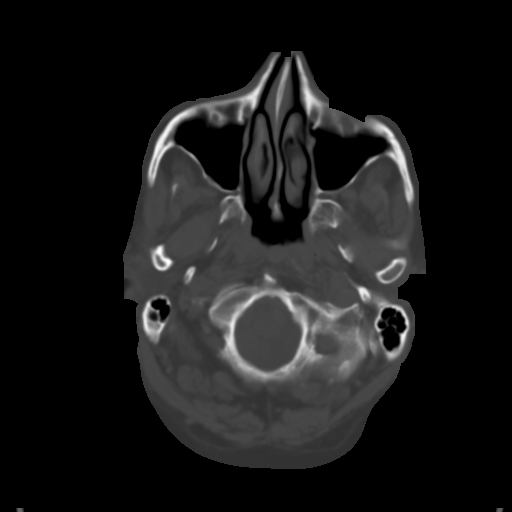
[im 7/32  brain]
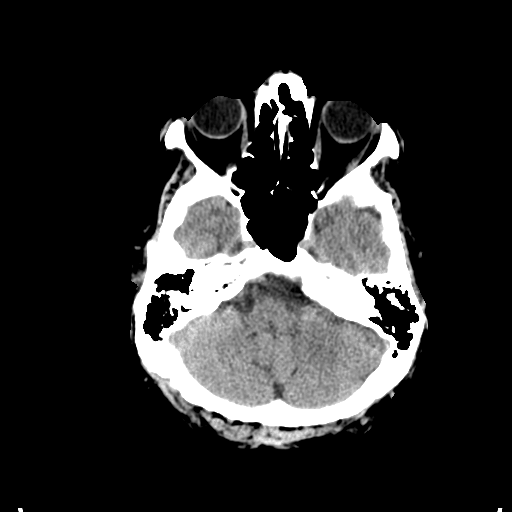
[im 10/32  brain]
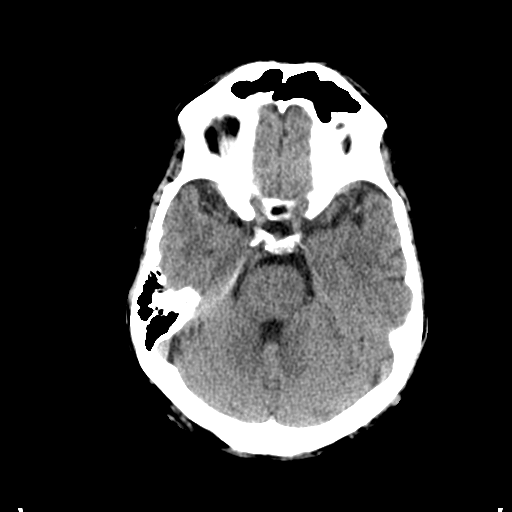
[im 14/32  brain]
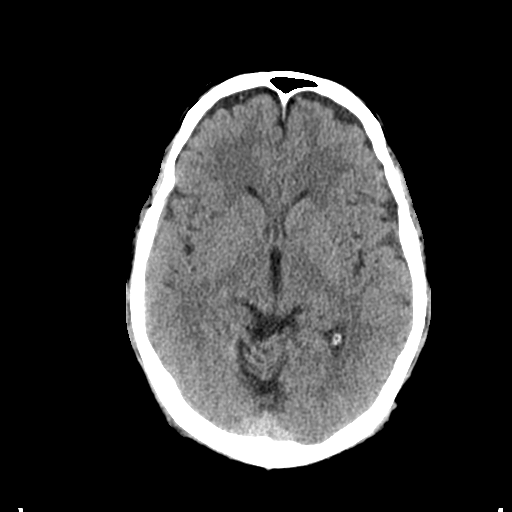
[im 18/32  brain]
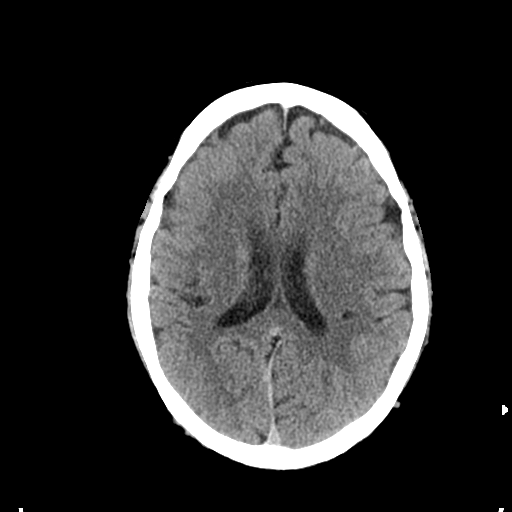
[im 18/32  bone]
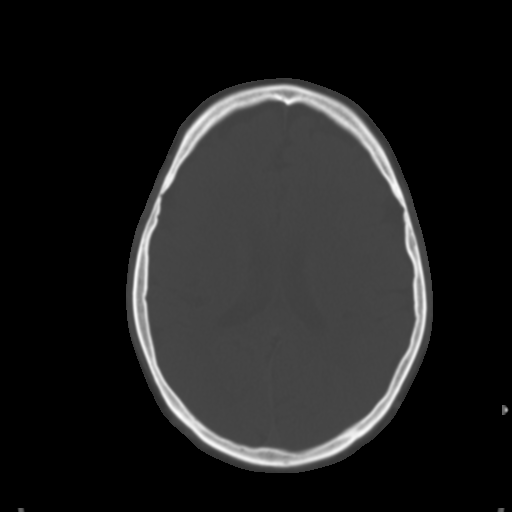
[im 22/32  brain]
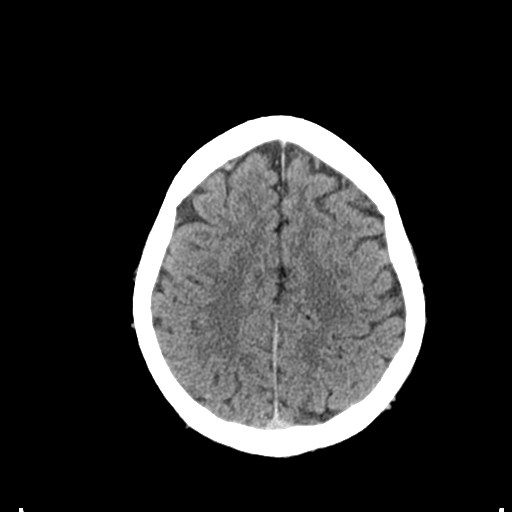
[im 25/32  brain]
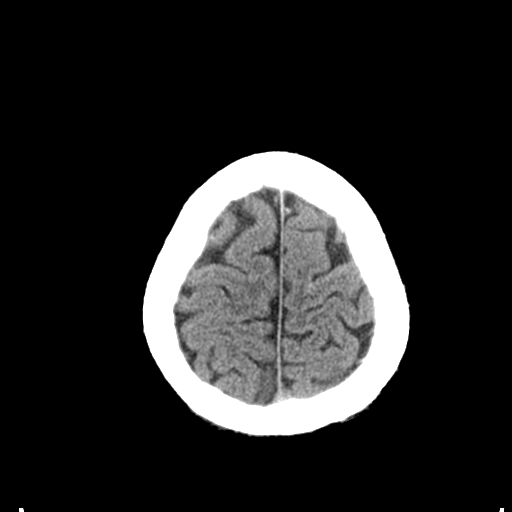
[im 29/32  brain]
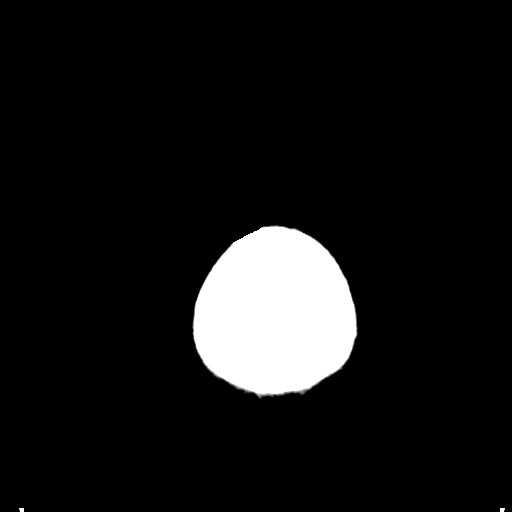

[Series 5: coronal soft tissue · coronal · 0.31mm/px · 3 of 72 slices shown]
[im 24/72  brain]
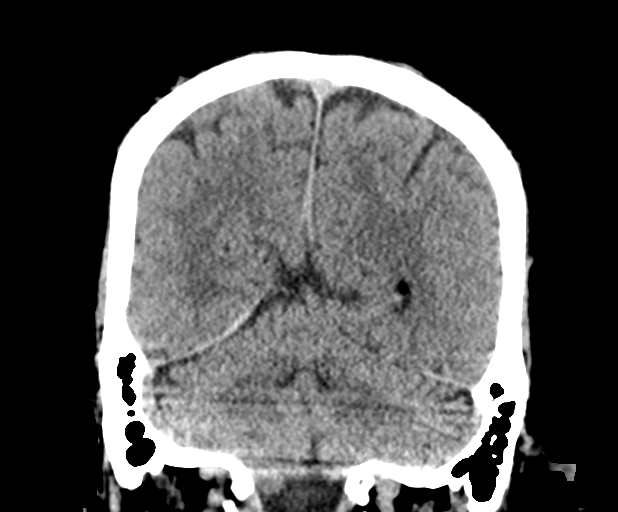
[im 32/72  brain]
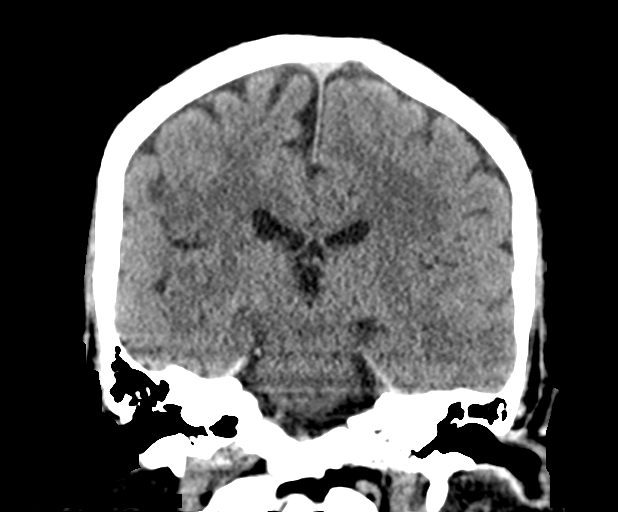
[im 40/72  brain]
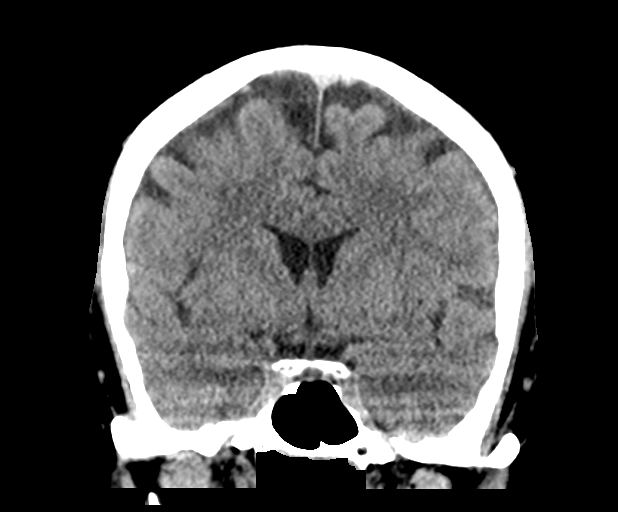

[Series 6: sagittal soft tissue · sagittal · 0.33mm/px · 3 of 56 slices shown]
[im 19/56  brain]
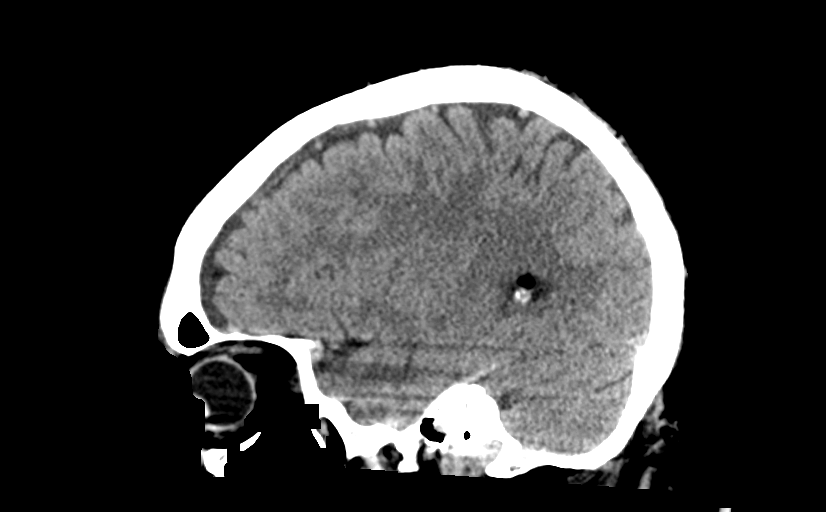
[im 28/56  brain]
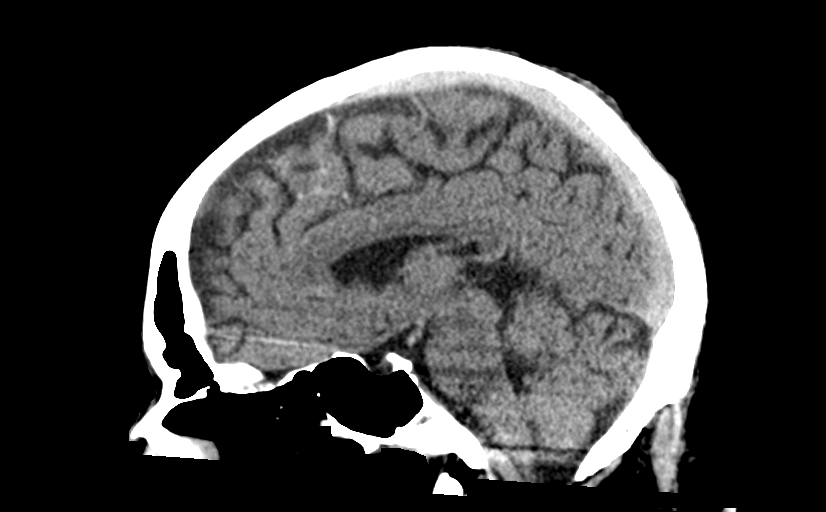
[im 37/56  brain]
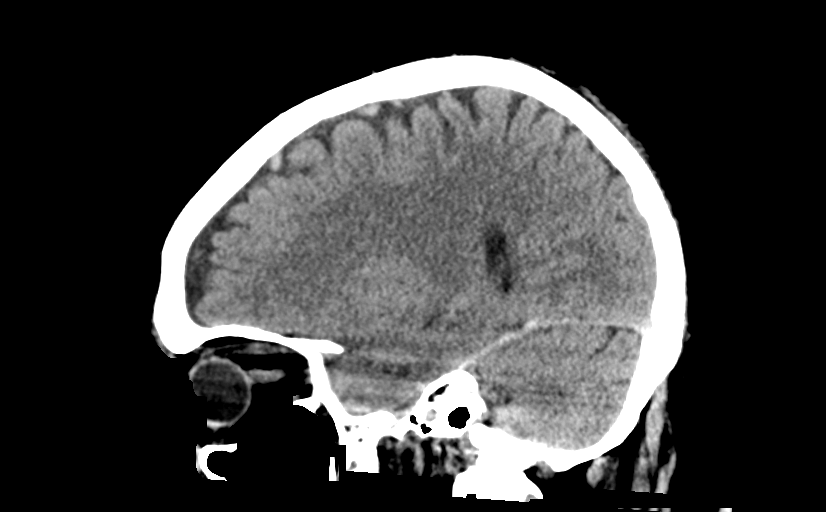

[14 of 47 positions shown; findings below may reference images not displayed]

FINDINGS: Brain: No evidence of acute infarction, hemorrhage, hydrocephalus,
extra-axial collection or mass lesion/mass effect.

Vascular: No hyperdense vessel or unexpected calcification.

Skull: Intact.

Sinuses/Orbits: Negative.

Other: None.
IMPRESSION: Negative head CT.
